# Patient Record
Sex: Female | Born: 1980 | Race: White | Hispanic: No | Marital: Single | Smoking: Never smoker
Health system: Southern US, Community
[De-identification: ages and names within clinical notes are randomized; demographics above are authoritative.]

## PROBLEM LIST (undated history)

## (undated) DIAGNOSIS — F4310 Post-traumatic stress disorder, unspecified: Secondary | ICD-10-CM

---

## 2017-05-26 ENCOUNTER — Encounter (HOSPITAL_COMMUNITY): Payer: Self-pay

## 2017-05-26 ENCOUNTER — Emergency Department (HOSPITAL_COMMUNITY): Payer: Self-pay

## 2017-05-26 ENCOUNTER — Inpatient Hospital Stay (HOSPITAL_COMMUNITY)
Admission: EM | Admit: 2017-05-26 | Discharge: 2017-05-29 | DRG: 897 | Disposition: A | Discharge status: Home / Self Care | Payer: Self-pay | Attending: Family Medicine | Admitting: Family Medicine

## 2017-05-26 DIAGNOSIS — Z818 Family history of other mental and behavioral disorders: Secondary | ICD-10-CM

## 2017-05-26 DIAGNOSIS — R079 Chest pain, unspecified: Secondary | ICD-10-CM

## 2017-05-26 DIAGNOSIS — R778 Other specified abnormalities of plasma proteins: Secondary | ICD-10-CM

## 2017-05-26 DIAGNOSIS — F29 Unspecified psychosis not due to a substance or known physiological condition: Secondary | ICD-10-CM

## 2017-05-26 DIAGNOSIS — F15129 Other stimulant abuse with intoxication, unspecified: Principal | ICD-10-CM | POA: Diagnosis present

## 2017-05-26 DIAGNOSIS — F209 Schizophrenia, unspecified: Secondary | ICD-10-CM | POA: Diagnosis present

## 2017-05-26 DIAGNOSIS — F1510 Other stimulant abuse, uncomplicated: Secondary | ICD-10-CM

## 2017-05-26 DIAGNOSIS — F4310 Post-traumatic stress disorder, unspecified: Secondary | ICD-10-CM | POA: Diagnosis present

## 2017-05-26 DIAGNOSIS — R7989 Other specified abnormal findings of blood chemistry: Secondary | ICD-10-CM

## 2017-05-26 DIAGNOSIS — F15929 Other stimulant use, unspecified with intoxication, unspecified: Secondary | ICD-10-CM | POA: Diagnosis present

## 2017-05-26 DIAGNOSIS — E876 Hypokalemia: Secondary | ICD-10-CM

## 2017-05-26 HISTORY — DX: Post-traumatic stress disorder, unspecified: F43.10

## 2017-05-26 LAB — CBC WITH DIFFERENTIAL/PLATELET
BASOS ABS: 0 10*3/uL (ref 0.0–0.1)
Basophils Relative: 0 %
EOS ABS: 0 10*3/uL (ref 0.0–0.7)
EOS PCT: 0 %
HCT: 35.6 % — ABNORMAL LOW (ref 36.0–46.0)
Hemoglobin: 12 g/dL (ref 12.0–15.0)
LYMPHS PCT: 4 %
Lymphs Abs: 0.8 10*3/uL (ref 0.7–4.0)
MCH: 30.5 pg (ref 26.0–34.0)
MCHC: 33.7 g/dL (ref 30.0–36.0)
MCV: 90.4 fL (ref 78.0–100.0)
Monocytes Absolute: 0.4 10*3/uL (ref 0.1–1.0)
Monocytes Relative: 2 %
Neutro Abs: 17.3 10*3/uL — ABNORMAL HIGH (ref 1.7–7.7)
Neutrophils Relative %: 94 %
PLATELETS: 297 10*3/uL (ref 150–400)
RBC: 3.94 MIL/uL (ref 3.87–5.11)
RDW: 13.2 % (ref 11.5–15.5)
WBC: 18.5 10*3/uL — ABNORMAL HIGH (ref 4.0–10.5)

## 2017-05-26 LAB — COMPREHENSIVE METABOLIC PANEL
ALBUMIN: 3.8 g/dL (ref 3.5–5.0)
ALT: 19 U/L (ref 14–54)
ANION GAP: 10 (ref 5–15)
AST: 32 U/L (ref 15–41)
Alkaline Phosphatase: 67 U/L (ref 38–126)
BUN: 10 mg/dL (ref 6–20)
CHLORIDE: 110 mmol/L (ref 101–111)
CO2: 20 mmol/L — AB (ref 22–32)
Calcium: 8.8 mg/dL — ABNORMAL LOW (ref 8.9–10.3)
Creatinine, Ser: 1.01 mg/dL — ABNORMAL HIGH (ref 0.44–1.00)
GFR calc Af Amer: >60 mL/min (ref >60)
GFR calc non Af Amer: >60 mL/min (ref >60)
GLUCOSE: 76 mg/dL (ref 65–99)
POTASSIUM: 3.2 mmol/L — AB (ref 3.5–5.1)
SODIUM: 140 mmol/L (ref 135–145)
TOTAL PROTEIN: 6.6 g/dL (ref 6.5–8.1)
Total Bilirubin: 1.1 mg/dL (ref 0.3–1.2)

## 2017-05-26 LAB — PREGNANCY, URINE: Preg Test, Ur: NEGATIVE

## 2017-05-26 LAB — URINALYSIS, ROUTINE W REFLEX MICROSCOPIC
Bilirubin Urine: NEGATIVE
GLUCOSE, UA: NEGATIVE mg/dL
Ketones, ur: 20 mg/dL — AB
LEUKOCYTES UA: NEGATIVE
NITRITE: NEGATIVE
PROTEIN: 30 mg/dL — AB
Specific Gravity, Urine: 1.017 (ref 1.005–1.030)
pH: 6 (ref 5.0–8.0)

## 2017-05-26 LAB — RAPID URINE DRUG SCREEN, HOSP PERFORMED
Amphetamines: POSITIVE — AB
BARBITURATES: NOT DETECTED
BENZODIAZEPINES: NOT DETECTED
Cocaine: NOT DETECTED
Opiates: NOT DETECTED
Tetrahydrocannabinol: NOT DETECTED

## 2017-05-26 LAB — TROPONIN I: TROPONIN I: 0.09 ng/mL — AB (ref <0.03)

## 2017-05-26 LAB — ETHANOL: Alcohol, Ethyl (B): <5 mg/dL (ref <5)

## 2017-05-26 MED ORDER — SODIUM CHLORIDE 0.9 % IV BOLUS (SEPSIS)
500.0000 mL | Freq: Once | INTRAVENOUS | Status: AC
  Administered 2017-05-27: 500 mL via INTRAVENOUS
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
## 2017-05-27 ENCOUNTER — Encounter (HOSPITAL_COMMUNITY): Payer: Self-pay | Admitting: Family Medicine

## 2017-05-27 DIAGNOSIS — E876 Hypokalemia: Secondary | ICD-10-CM | POA: Diagnosis present

## 2017-05-27 DIAGNOSIS — F4310 Post-traumatic stress disorder, unspecified: Secondary | ICD-10-CM

## 2017-05-27 DIAGNOSIS — R7989 Other specified abnormal findings of blood chemistry: Secondary | ICD-10-CM

## 2017-05-27 DIAGNOSIS — F29 Unspecified psychosis not due to a substance or known physiological condition: Secondary | ICD-10-CM

## 2017-05-27 DIAGNOSIS — R778 Other specified abnormalities of plasma proteins: Secondary | ICD-10-CM | POA: Diagnosis present

## 2017-05-27 DIAGNOSIS — F15929 Other stimulant use, unspecified with intoxication, unspecified: Secondary | ICD-10-CM | POA: Diagnosis present

## 2017-05-27 LAB — BASIC METABOLIC PANEL
Anion gap: 8 (ref 5–15)
BUN: 9 mg/dL (ref 6–20)
CHLORIDE: 115 mmol/L — AB (ref 101–111)
CO2: 17 mmol/L — ABNORMAL LOW (ref 22–32)
CREATININE: 0.65 mg/dL (ref 0.44–1.00)
Calcium: 8.4 mg/dL — ABNORMAL LOW (ref 8.9–10.3)
GFR calc Af Amer: >60 mL/min (ref >60)
GFR calc non Af Amer: >60 mL/min (ref >60)
GLUCOSE: 84 mg/dL (ref 65–99)
POTASSIUM: 4.2 mmol/L (ref 3.5–5.1)
SODIUM: 140 mmol/L (ref 135–145)

## 2017-05-27 LAB — TROPONIN I
TROPONIN I: 0.11 ng/mL — AB (ref <0.03)
TROPONIN I: 0.06 ng/mL — AB (ref <0.03)
TROPONIN I: 0.05 ng/mL — AB (ref <0.03)
Troponin I: 0.06 ng/mL (ref <0.03)

## 2017-05-27 LAB — CBC
HEMATOCRIT: 36.1 % (ref 36.0–46.0)
Hemoglobin: 11.7 g/dL — ABNORMAL LOW (ref 12.0–15.0)
MCH: 29.9 pg (ref 26.0–34.0)
MCHC: 32.4 g/dL (ref 30.0–36.0)
MCV: 92.3 fL (ref 78.0–100.0)
PLATELETS: 280 10*3/uL (ref 150–400)
RBC: 3.91 MIL/uL (ref 3.87–5.11)
RDW: 13.5 % (ref 11.5–15.5)
WBC: 12.2 10*3/uL — AB (ref 4.0–10.5)

## 2017-05-27 LAB — MAGNESIUM: MAGNESIUM: 2.2 mg/dL (ref 1.7–2.4)

## 2017-05-27 LAB — MRSA PCR SCREENING: MRSA BY PCR: NEGATIVE

## 2017-05-27 MED ORDER — HEPARIN BOLUS VIA INFUSION
4000.0000 [IU] | Freq: Once | INTRAVENOUS | Status: AC
  Administered 2017-05-27: 4000 [IU] via INTRAVENOUS
  Filled 2017-05-27: qty 4000

## 2017-05-27 MED ORDER — ASPIRIN 81 MG PO CHEW
324.0000 mg | CHEWABLE_TABLET | Freq: Once | ORAL | Status: AC
  Administered 2017-05-27: 324 mg via ORAL
  Filled 2017-05-27: qty 4

## 2017-05-27 MED ORDER — HEPARIN (PORCINE) IN NACL 100-0.45 UNIT/ML-% IJ SOLN
1000.0000 [IU]/h | INTRAMUSCULAR | Status: DC
  Administered 2017-05-27: 1000 [IU]/h via INTRAVENOUS
  Filled 2017-05-27: qty 250

## 2017-05-27 MED ORDER — ONDANSETRON HCL 4 MG/2ML IJ SOLN: 4.0000 mg | Freq: Four times a day (QID) | INTRAMUSCULAR | Status: DC | PRN

## 2017-05-27 MED ORDER — LORAZEPAM 1 MG PO TABS: 1.0000 mg | ORAL_TABLET | Freq: Four times a day (QID) | ORAL | Status: DC | PRN

## 2017-05-27 MED ORDER — ACETAMINOPHEN 325 MG PO TABS: 650.0000 mg | ORAL_TABLET | ORAL | Status: DC | PRN

## 2017-05-27 MED ORDER — POTASSIUM CHLORIDE CRYS ER 20 MEQ PO TBCR
40.0000 meq | EXTENDED_RELEASE_TABLET | Freq: Once | ORAL | Status: AC
  Administered 2017-05-27: 40 meq via ORAL
  Filled 2017-05-27: qty 2

## 2017-05-27 MED ORDER — NITROGLYCERIN 0.4 MG SL SUBL: 0.4000 mg | SUBLINGUAL_TABLET | SUBLINGUAL | Status: DC | PRN

## 2017-05-27 MED ORDER — ASPIRIN EC 81 MG PO TBEC
81.0000 mg | DELAYED_RELEASE_TABLET | Freq: Every day | ORAL | Status: DC
  Administered 2017-05-28 – 2017-05-29 (×2): 81 mg via ORAL
  Filled 2017-05-27 (×2): qty 1

## 2017-05-27 MED ORDER — LORAZEPAM 2 MG/ML IJ SOLN
1.0000 mg | Freq: Four times a day (QID) | INTRAMUSCULAR | Status: DC | PRN
Start: 2017-05-27 — End: 2017-05-29
  Administered 2017-05-27: 1 mg via INTRAVENOUS
  Filled 2017-05-27: qty 1
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
## 2017-05-27 NOTE — ED Notes (Addendum)
Tacey Heap, RN and Dr Janee Morn aware of Troponin 0.06.

## 2017-05-27 NOTE — Progress Notes (Signed)
ANTICOAGULATION CONSULT NOTE - Initial Consult  Pharmacy Consult for heparin Indication: chest pain/ACS  No Known Allergies  Patient Measurements: Height:  (162.6 cm) Weight: 190 lb (86.2 kg) IBW/kg (Calculated) : 54.7 Heparin Dosing Weight: 75kg  Vital Signs: Temp: 97.9 F (36.6 C) (09/24 2315) Temp Source: Rectal (09/24 2315) BP: 100/60 (09/25 0300) Pulse Rate: 96 (09/25 0300)  Labs:  Recent Labs  05/26/17 2148 05/27/17 0100  HGB 12.0  --   HCT 35.6*  --   PLT 297  --   CREATININE 1.01*  --   TROPONINI 0.09* 0.11*    Estimated Creatinine Clearance: 83.4 mL/min (A) (by C-G formula based on SCr of 1.01 mg/dL (H)).   Assessment: 36yo female c/o generalized CP, initial troponin elevated and now trending up, to begin heparin.  Goal of Therapy:  Heparin level 0.3-0.7 units/ml Monitor platelets by anticoagulation protocol: Yes   Plan:  Will give heparin 4000 units VI bolus x1 followed by gtt at 1000 units/hr and monitor heparin levels and CBC.  Vernard Gambles, PharmD, BCPS  05/27/2017,3:11 AM

## 2017-05-27 NOTE — ED Notes (Signed)
Patient given Ativan 1 mg IV for anxiety this morning .
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
## 2017-05-27 NOTE — ED Notes (Signed)
  Cards PA here to see patient

## 2017-05-27 NOTE — ED Provider Notes (Signed)
Patient signed out to me to evaluate repeat troponin. She had presented with chest pain and mental status change worrisome for acute psychiatric event. Drug screen was positive for amphetamines. ECG showed borderline T-wave flattening. Initial troponin was elevated at 0.09. Repeat troponin has gone up slightly to 0.11. She is started on heparin and will need to be admittedto monitor cardiac enzymes. Once she is cleared from cardiac standpoint, she will need psychiatric evaluation. Case is discussed with Dr. Maryfrances Bunnell of [facility] who agrees to admit the patient. Also, she is noted to have hypokalemia and is given potassium supplement.  Results for orders placed or performed during the hospital encounter of 05/26/17  Comprehensive metabolic panel  Result Value Ref Range   Sodium 140 135 - 145 mmol/L   Potassium 3.2 (L) 3.5 - 5.1 mmol/L   Chloride 110 101 - 111 mmol/L   CO2 20 (L) 22 - 32 mmol/L   Glucose, Bld 76 65 - 99 mg/dL   BUN 10 6 - 20 mg/dL   Creatinine, Ser 1.61 (H) 0.44 - 1.00 mg/dL   Calcium 8.8 (L) 8.9 - 10.3 mg/dL   Total Protein 6.6 6.5 - 8.1 g/dL   Albumin 3.8 3.5 - 5.0 g/dL   AST 32 15 - 41 U/L   ALT 19 14 - 54 U/L   Alkaline Phosphatase 67 38 - 126 U/L   Total Bilirubin 1.1 0.3 - 1.2 mg/dL   GFR calc non Af Amer >60 >60 mL/min   GFR calc Af Amer >60 >60 mL/min   Anion gap 10 5 - 15  Ethanol  Result Value Ref Range   Alcohol, Ethyl (B) <5 <5 mg/dL  Urinalysis, Routine w reflex microscopic  Result Value Ref Range   Color, Urine YELLOW YELLOW   APPearance HAZY (A) CLEAR   Specific Gravity, Urine 1.017 1.005 - 1.030   pH 6.0 5.0 - 8.0   Glucose, UA NEGATIVE NEGATIVE mg/dL   Hgb urine dipstick MODERATE (A) NEGATIVE   Bilirubin Urine NEGATIVE NEGATIVE   Ketones, ur 20 (A) NEGATIVE mg/dL   Protein, ur 30 (A) NEGATIVE mg/dL   Nitrite NEGATIVE NEGATIVE   Leukocytes, UA NEGATIVE NEGATIVE   RBC / HPF 0-5 0 - 5 RBC/hpf   WBC, UA 0-5 0 - 5 WBC/hpf   Bacteria, UA RARE  (A) NONE SEEN   Squamous Epithelial / LPF 0-5 (A) NONE SEEN   Mucus PRESENT    Hyaline Casts, UA PRESENT   Pregnancy, urine  Result Value Ref Range   Preg Test, Ur NEGATIVE NEGATIVE  Urine rapid drug screen (hosp performed)  Result Value Ref Range   Opiates NONE DETECTED NONE DETECTED   Cocaine NONE DETECTED NONE DETECTED   Benzodiazepines NONE DETECTED NONE DETECTED   Amphetamines POSITIVE (A) NONE DETECTED   Tetrahydrocannabinol NONE DETECTED NONE DETECTED   Barbiturates NONE DETECTED NONE DETECTED  CBC with Differential  Result Value Ref Range   WBC 18.5 (H) 4.0 - 10.5 K/uL   RBC 3.94 3.87 - 5.11 MIL/uL   Hemoglobin 12.0 12.0 - 15.0 g/dL   HCT 09.6 (L) 04.5 - 40.9 %   MCV 90.4 78.0 - 100.0 fL   MCH 30.5 26.0 - 34.0 pg   MCHC 33.7 30.0 - 36.0 g/dL   RDW 81.1 91.4 - 78.2 %   Platelets 297 150 - 400 K/uL   Neutrophils Relative % 94 %   Neutro Abs 17.3 (H) 1.7 - 7.7 K/uL   Lymphocytes Relative 4 %  Lymphs Abs 0.8 0.7 - 4.0 K/uL   Monocytes Relative 2 %   Monocytes Absolute 0.4 0.1 - 1.0 K/uL   Eosinophils Relative 0 %   Eosinophils Absolute 0.0 0.0 - 0.7 K/uL   Basophils Relative 0 %   Basophils Absolute 0.0 0.0 - 0.1 K/uL  Troponin I  Result Value Ref Range   Troponin I 0.09 (HH) <0.03 ng/mL  Troponin I  Result Value Ref Range   Troponin I 0.11 (HH) <0.03 ng/mL   Ct Head Wo Contrast  Result Date: 05/26/2017 CLINICAL DATA:  Altered level of consciousness. EXAM: CT HEAD WITHOUT CONTRAST TECHNIQUE: Contiguous axial images were obtained from the base of the skull through the vertex without intravenous contrast. COMPARISON:  None. FINDINGS: Brain: No evidence of acute infarction, hemorrhage, hydrocephalus, extra-axial collection or mass lesion/mass effect. Mild advanced for age bifrontal atrophy. Vascular: No hyperdense vessel or unexpected calcification. Skull: Normal. Negative for fracture or focal lesion. Sinuses/Orbits: No acute finding. Tiny mucous retention cyst in the  sphenoid. Other: None IMPRESSION: No acute intracranial abnormality.  Mild bifrontal atrophy for age. Electronically Signed   By: Tollie Eth M.D.   On: 05/26/2017 22:14   Dg Chest Portable 1 View  Result Date: 05/26/2017 CLINICAL DATA:  Altered mental status EXAM: PORTABLE CHEST 1 VIEW COMPARISON:  None. FINDINGS: Mildly low lung volumes. No acute consolidation or effusion. Normal cardiomediastinal silhouette. No pneumothorax. IMPRESSION: No active disease. Electronically Signed   By: Jasmine Pang M.D.   On: 05/26/2017 21:49   CRITICAL CARE Performed by: [email or web address] Total critical care time: 35 minutes Critical care time was exclusive of separately billable procedures and treating other patients. Critical care was necessary to treat or prevent imminent or life-threatening deterioration. Critical care was time spent personally by me on the following activities: development of treatment plan with patient and/or surrogate as well as nursing, discussions with consultants, evaluation of patient's response to treatment, examination of patient, obtaining history from patient or surrogate, ordering and performing treatments and interventions, ordering and review of laboratory studies, ordering and review of radiographic studies, pulse oximetry and re-evaluation of patient's condition.   Dione Booze, MD 05/27/17 [phone]

## 2017-05-27 NOTE — ED Notes (Signed)
Dr. Preston Fleeting notified on pt.'s elevated troponin result.
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
## 2017-05-27 NOTE — ED Notes (Signed)
Report called to 6E 

## 2017-05-27 NOTE — ED Notes (Signed)
This RN attempted to wanr patient by voice several times before waking her to draw her blood.  Patient jumped and flinched in the bed.  Patient states "I'm really confused, what are you doing to me?".  This RN had to explain procedure to pull blood from IV multiple times.  Patient did not want RN to remove blood at first, but after explaining a third time, patient agreed.  Patient then suspicious and concerned when RN needed to flush line after.
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
## 2017-05-28 ENCOUNTER — Other Ambulatory Visit (HOSPITAL_COMMUNITY): Payer: Self-pay

## 2017-05-28 ENCOUNTER — Inpatient Hospital Stay (HOSPITAL_COMMUNITY): Payer: Self-pay

## 2017-05-28 DIAGNOSIS — R0789 Other chest pain: Secondary | ICD-10-CM

## 2017-05-28 DIAGNOSIS — Z630 Problems in relationship with spouse or partner: Secondary | ICD-10-CM

## 2017-05-28 DIAGNOSIS — R079 Chest pain, unspecified: Secondary | ICD-10-CM

## 2017-05-28 DIAGNOSIS — R748 Abnormal levels of other serum enzymes: Secondary | ICD-10-CM

## 2017-05-28 DIAGNOSIS — F22 Delusional disorders: Secondary | ICD-10-CM

## 2017-05-28 DIAGNOSIS — Z818 Family history of other mental and behavioral disorders: Secondary | ICD-10-CM

## 2017-05-28 DIAGNOSIS — F29 Unspecified psychosis not due to a substance or known physiological condition: Secondary | ICD-10-CM

## 2017-05-28 DIAGNOSIS — Z813 Family history of other psychoactive substance abuse and dependence: Secondary | ICD-10-CM

## 2017-05-28 LAB — ECHOCARDIOGRAM COMPLETE
Height: 64 in
WEIGHTICAEL: 2918.4 [oz_av]

## 2017-05-28 LAB — HIV ANTIBODY (ROUTINE TESTING W REFLEX): HIV Screen 4th Generation wRfx: NONREACTIVE

## 2017-05-28 NOTE — Progress Notes (Signed)
  Echocardiogram 2D Echocardiogram has been performed.  Celene Skeen 05/28/2017, 10:30 AM

## 2017-05-28 NOTE — Plan of Care (Signed)
Problem: Pain Managment: Goal: General experience of comfort will improve Outcome: Progressing Patient denies pain   
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
## 2017-05-28 NOTE — Progress Notes (Signed)
Progress Note  Patient Name: Samantha Petty Date of Encounter: 05/28/2017  Primary Cardiologist: New - Dr. Delton See  Subjective   Resting this morning but arousable and answering questions. She denies any recurrent chest pain or dyspnea. Reports having little energy.   Inpatient Medications    Scheduled Meds: . aspirin EC  81 mg Oral Daily   Continuous Infusions:  PRN Meds: acetaminophen, LORazepam **OR** LORazepam, nitroGLYCERIN, ondansetron (ZOFRAN) IV   Vital Signs    Vitals:   05/27/17 1400 05/28/17 0100 05/28/17 0605 05/28/17 0812  BP: (!) 89/70 101/67 111/71 123/76  Pulse: 97 90 87 89  Resp: (!) 22 20 (!) 23   Temp:  98 F (36.7 C) 98.1 F (36.7 C) 97.7 F (36.5 C)  TempSrc:  Oral Oral Oral  SpO2: 98% 100% 100% 100%  Weight:   182 lb 6.4 oz (82.7 kg)   Height:       No intake or output data in the 24 hours ending 05/28/17 0904 Filed Weights   05/27/17 0300 05/28/17 0605  Weight: 190 lb (86.2 kg) 182 lb 6.4 oz (82.7 kg)    Telemetry    NSR, HR in 80's to low-100's.  - Personally Reviewed  ECG    Sinus tachycardia, HR 106.  - Personally Reviewed  Physical Exam   General: Well developed, well nourished Caucaisan female appearing in no acute distress. Head: Normocephalic, small laceration to forehead  Neck: Supple without bruits, JVD not elevated. Lungs:  Resp regular and unlabored, CTA without wheezing or rales. Heart: RRR, S1, S2, no S3, S4, or murmur; no rub. Abdomen: Soft, non-tender, non-distended with normoactive bowel sounds. No hepatomegaly. No rebound/guarding. No obvious abdominal masses. Extremities: No clubbing, cyanosis, or edema. Distal pedal pulses are 2+ bilaterally. Multiple excoriations noted along lower extremities bilaterally.  Neuro: Alert and oriented X 3. Moves all extremities spontaneously. Psych: Normal affect.  Labs    Chemistry Recent Labs Lab 05/26/17 2148 05/27/17 0957  NA 140 140  K 3.2* 4.2  CL 110 115*  CO2 20*  17*  GLUCOSE 76 84  BUN 10 9  CREATININE 1.01* 0.65  CALCIUM 8.8* 8.4*  PROT 6.6  --   ALBUMIN 3.8  --   AST 32  --   ALT 19  --   ALKPHOS 67  --   BILITOT 1.1  --   GFRNONAA >60 >60  GFRAA >60 >60  ANIONGAP 10 8     Hematology Recent Labs Lab 05/26/17 2148 05/27/17 0957  WBC 18.5* 12.2*  RBC 3.94 3.91  HGB 12.0 11.7*  HCT 35.6* 36.1  MCV 90.4 92.3  MCH 30.5 29.9  MCHC 33.7 32.4  RDW 13.2 13.5  PLT 297 280    Cardiac Enzymes Recent Labs Lab 05/27/17 0100 05/27/17 1135 05/27/17 1545 05/27/17 2210  TROPONINI 0.11* 0.06* 0.06* 0.05*   No results for input(s): TROPIPOC in the last 168 hours.   BNPNo results for input(s): BNP, PROBNP in the last 168 hours.   DDimer No results for input(s): DDIMER in the last 168 hours.   Radiology    Ct Head Wo Contrast  Result Date: 05/26/2017 CLINICAL DATA:  Altered level of consciousness. EXAM: CT HEAD WITHOUT CONTRAST TECHNIQUE: Contiguous axial images were obtained from the base of the skull through the vertex without intravenous contrast. COMPARISON:  None. FINDINGS: Brain: No evidence of acute infarction, hemorrhage, hydrocephalus, extra-axial collection or mass lesion/mass effect. Mild advanced for age bifrontal atrophy. Vascular: No hyperdense vessel or unexpected  calcification. Skull: Normal. Negative for fracture or focal lesion. Sinuses/Orbits: No acute finding. Tiny mucous retention cyst in the sphenoid. Other: None IMPRESSION: No acute intracranial abnormality.  Mild bifrontal atrophy for age. Electronically Signed   By: Tollie Eth M.D.   On: 05/26/2017 22:14   Dg Chest Portable 1 View  Result Date: 05/26/2017 CLINICAL DATA:  Altered mental status EXAM: PORTABLE CHEST 1 VIEW COMPARISON:  None. FINDINGS: Mildly low lung volumes. No acute consolidation or effusion. Normal cardiomediastinal silhouette. No pneumothorax. IMPRESSION: No active disease. Electronically Signed   By: Jasmine Pang M.D.   On: 05/26/2017 21:49     Cardiac Studies   Echocardiogram: Pending  Patient Profile     36 y.o. female w/ PMH of PTSD who presented to [facility] on 05/26/2017 for evaluation of confusion and paranoia. Cardiology consulted for evaluation of chest pain and elevated troponin.   Assessment & Plan    1. Chest Pain/ Elevated Troponin - presented with an episode of paranoia and confusion but reported chest pain throughout the day. EKG without acute ischemic changes. Cyclic troponin values have been flat at 0.09, 0.11, 0.06, 0.06, and 0.05. - echocardiogram is pending to assess LV Function and wall motion. If normal, no further cardiac evaluation is indicated this admission.   2. Amphetamine Intoxication/ Psychosis - per admitting team  Signed, Ellsworth Lennox , PA-C 9:04 AM 05/28/2017 Pager: [phone]   The patient was seen, examined and discussed with Randall An, PA-C and I agree with the above.    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  The patient remains chest pain free, Troponin minimally elevated and now downtrending.  Telemetry is normal. EKG shows sinus tachycardia otherwise normal EKG.  At this point she is chest pain-free, with minimal troponin elevation is now down trending. We will not plan for an ischemic workup. The mainstay in here would be stable way from drugs especially stimulants such an amphetamines. We will obtain an echocardiogram if normal we will sign off. If her chest pain recurs will consider an outpatient stress test.  Tobias Alexander, MD 05/28/2017

## 2017-05-29 DIAGNOSIS — F1510 Other stimulant abuse, uncomplicated: Secondary | ICD-10-CM

## 2017-05-29 DIAGNOSIS — R079 Chest pain, unspecified: Secondary | ICD-10-CM

## 2017-05-29 LAB — CBC
HEMATOCRIT: 37.8 % (ref 36.0–46.0)
Hemoglobin: 12.3 g/dL (ref 12.0–15.0)
MCH: 30.1 pg (ref 26.0–34.0)
MCHC: 32.5 g/dL (ref 30.0–36.0)
MCV: 92.6 fL (ref 78.0–100.0)
Platelets: 311 10*3/uL (ref 150–400)
RBC: 4.08 MIL/uL (ref 3.87–5.11)
RDW: 13.4 % (ref 11.5–15.5)
WBC: 6.7 10*3/uL (ref 4.0–10.5)

## 2017-05-29 MED ORDER — QUETIAPINE FUMARATE 50 MG PO TABS: 50.0000 mg | ORAL_TABLET | Freq: Every day | ORAL | 0 refills | Status: AC

## 2017-05-29 MED ORDER — QUETIAPINE FUMARATE 50 MG PO TABS: 50.0000 mg | ORAL_TABLET | Freq: Every day | ORAL | Status: DC

## 2017-05-29 NOTE — Progress Notes (Signed)
PROGRESS NOTE  Patient seen 05/28/17: Note entered 9/27  Samantha Petty  KGM:[id number] DOB: 20-Sep-1980 DOA: 05/26/2017 PCP: System, Pcp Not In    Brief Narrative:  36 y.o. female with a past medical history significant for PTSD who presents with acute paranoia, chest pain.   Assessment & Plan:    Psychosis - evaluated by psychiatry team and patient felt to be delusional. She will require inpatient psychiatric evaluation  Principal Problem:   Amphetamine intoxication (HCC) - Vital signs within normal limits. Pt was sleepy when I went into room. I suspect this has resolved  Active Problems:   Elevated troponin - most likely related to above problem. Pt has no complaints of chest pain    PTSD (post-traumatic stress disorder)   Hypokalemia - resolved on last check   DVT prophylaxis: early ambulation Code Status: Full Family Communication: none Disposition Plan: inpatient psychiatric recommendation  Consultants:   psychiatry   Procedures: none   Antimicrobials: none   Subjective: Pt has no new complaints. No chest pain reported. She requested to go home but had not been evaluated by psychiatry. After evaluation psychiatry recommended in patient evaluation at [facility]  Objective: Vitals:   05/28/17 1653 05/28/17 2105 05/29/17 0554 05/29/17 0744  BP: 116/86 122/82 123/80 131/87  Pulse:  94 91   Resp: (!) Temp: 98.3 F (36.8 C) 98.3 F (36.8 C) 98 F (36.7 C) 99.2 F (37.3 C)  TempSrc: Oral Oral Oral Oral  SpO2: 100% 100% 100% 98%  Weight:   83 kg (183 lb)   Height:       No intake or output data in the 24 hours ending 05/29/17 0932 Filed Weights   05/27/17 0300 05/28/17 0605 05/29/17 0554  Weight: 86.2 kg (190 lb) 82.7 kg (182 lb 6.4 oz) 83 kg (183 lb)    Examination:  General exam: Appears calm and comfortable  Respiratory system: Clear to auscultation. Respiratory effort normal. Cardiovascular system: S1 & S2 heard, RRR. No JVD, murmurs, rubs,  gallops or clicks. No pedal edema. Gastrointestinal system: Abdomen is nondistended, soft and nontender. No organomegaly or masses felt. Normal bowel sounds heard. Central nervous system: Alert and oriented. No focal neurological deficits. Extremities: Symmetric 5 x 5 power. Skin: No rashes, lesions or ulcers Psychiatry: Judgement and insight appear impaired    Data Reviewed: I have personally reviewed following labs and imaging studies  CBC:  Recent Labs Lab 05/26/17 2148 05/27/17 0957 05/29/17 0615  WBC 18.5* 12.2* 6.7  NEUTROABS 17.3*  --   --   HGB 12.0 11.7* 12.3  HCT 35.6* 36.1 37.8  MCV 90.4 92.3 92.6  PLT 297 280 311   Basic Metabolic Panel:  Recent Labs Lab 05/26/17 2148 05/27/17 0100 05/27/17 0957  NA 140  --  140  K 3.2*  --  4.2  CL 110  --  115*  CO2 20*  --  17*  GLUCOSE 76  --  84  BUN 10  --  9  CREATININE 1.01*  --  0.65  CALCIUM 8.8*  --  8.4*  MG  --  2.2  --    GFR: Estimated Creatinine Clearance: 103.2 mL/min (by C-G formula based on SCr of 0.65 mg/dL). Liver Function Tests:  Recent Labs Lab 05/26/17 2148  AST 32  ALT 19  ALKPHOS 67  BILITOT 1.1  PROT 6.6  ALBUMIN 3.8   No results for input(s): LIPASE, AMYLASE in the last 168 hours. No results for input(s):  AMMONIA in the last 168 hours. Coagulation Profile: No results for input(s): INR, PROTIME in the last 168 hours. Cardiac Enzymes:  Recent Labs Lab 05/26/17 2148 05/27/17 0100 05/27/17 1135 05/27/17 1545 05/27/17 2210  TROPONINI 0.09* 0.11* 0.06* 0.06* 0.05*   BNP (last 3 results) No results for input(s): PROBNP in the last 8760 hours. HbA1C: No results for input(s): HGBA1C in the last 72 hours. CBG: No results for input(s): GLUCAP in the last 168 hours. Lipid Profile: No results for input(s): CHOL, HDL, LDLCALC, TRIG, CHOLHDL, LDLDIRECT in the last 72 hours. Thyroid Function Tests: No results for input(s): TSH, T4TOTAL, FREET4, T3FREE, THYROIDAB in the last 72  hours. Anemia Panel: No results for input(s): VITAMINB12, FOLATE, FERRITIN, TIBC, IRON, RETICCTPCT in the last 72 hours. Sepsis Labs: No results for input(s): PROCALCITON, LATICACIDVEN in the last 168 hours.  Recent Results (from the past 240 hour(s))  MRSA PCR Screening     Status: None   Collection Time: 05/27/17  8:33 PM  Result Value Ref Range Status   MRSA by PCR NEGATIVE NEGATIVE Final    Comment:        The GeneXpert MRSA Assay (FDA approved for NASAL specimens only), is one component of a comprehensive MRSA colonization surveillance program. It is not intended to diagnose MRSA infection nor to guide or monitor treatment for MRSA infections.          Radiology Studies: No results found.      Scheduled Meds: . aspirin EC  81 mg Oral Daily   Continuous Infusions:   LOS: 2 days    Time spent: > 35 minutes    Penny Pia, MD [facility] Pager [phone]  If 7PM-7AM, please contact night-coverage [email or web address] Password [facility] 05/29/2017, 9:32 AM

## 2017-05-29 NOTE — Clinical Social Work Note (Signed)
Clinical Social Work Assessment  Patient Details  Name: Samantha Petty MRN: [id number] Date of Birth: 1981/07/19  Date of referral:  05/29/17               Reason for consult:  Mental Health Concerns                Permission sought to share information with:  Family Supports Permission granted to share information::  Yes, Verbal Permission Granted  Name::     Samantha Petty::     Relationship::  father  Contact Information:     Housing/Transportation Living arrangements for the past 2 months:  Single Family Home Source of Information:  Patient Patient Interpreter Needed:  None Criminal Activity/Legal Involvement Pertinent to Current Situation/Hospitalization:    Significant Relationships:  Parents, Significant Other Lives with:  Self, Significant Other Do you feel safe going back to the place where you live?  Yes Need for family participation in patient care:  No (Coment)  Care giving concerns:  Patient from home with family. Patient came in with paranoid delusions reportedly substance induced. Psych initially recommended inpatient psychiatric care. Patient cleared by psych today and discharged by MD.   Social Worker assessment / plan: CSW referred patient out to [facility] and [facility] before cleared by psych-neither had beds. CSW faxed out to other facilities. CSW then informed by psych that patient cleared and safe for discharge home. CSW met with patient at bedside. Patient calm and alert and oriented. Patient from [place] with significant other and father. Patient tearful about her dog which she lost while under the influence. CSW planned with patient for transportation home. CSW and patient called patient's father and boyfriend together. Boyfriend will pick patient up from hospital this evening. CSW signing off.   Employment status:    Insurance information:    PT Recommendations:  Not assessed at this time Information / Referral to community resources:  Inpatient Psychiatric Care  (Comment Required)  Patient/Family's Response to care: Patient appreciative of care.  Patient/Family's Understanding of and Emotional Response to Diagnosis, Current Treatment, and Prognosis: Patient understanding of condition and beginning to remember the events that happened while she was intoxicated. Patient wants to return home.  Emotional Assessment Appearance:  Appears stated age Attitude/Demeanor/Rapport:  Other (appropriate) Affect (typically observed):  Accepting, Calm Orientation:  Oriented to Self, Oriented to Situation, Oriented to Place, Oriented to  Time Alcohol / Substance use:  Illicit Drugs Psych involvement (Current and /or in the community):  Yes (Comment)  Discharge Needs  Concerns to be addressed:  Discharge Planning Concerns, Mental Health Concerns Readmission within the last 30 days:  No Current discharge risk:    Barriers to Discharge:  No Barriers Identified   Estanislado Emms, LCSW 05/29/2017, 4:46 PM

## 2017-05-29 NOTE — Plan of Care (Signed)
Problem: Physical Regulation: Goal: Will remain free from infection Outcome: Progressing VSS. Displays no signs or symptoms of infection. Educated on the importance of proper hand hygiene

## 2017-05-29 NOTE — Discharge Summary (Signed)
Physician Discharge Summary  Samantha Petty ZOX:[id number] DOB: 08/31/1981 DOA: 05/26/2017  PCP: System, Pcp Not In  Admit date: 05/26/2017 Discharge date: 05/29/2017  Time spent: > 35  minutes  Recommendations for Outpatient Follow-up:  1.    Discharge Diagnoses:  Principal Problem:   Amphetamine intoxication (HCC) Active Problems:   Elevated troponin   PTSD (post-traumatic stress disorder)   Hypokalemia   Psychosis   Discharge Condition: stable  Diet recommendation: regular diet  Filed Weights   05/27/17 0300 05/28/17 0605 05/29/17 0554  Weight: 86.2 kg (190 lb) 82.7 kg (182 lb 6.4 oz) 83 kg (183 lb)    History of present illness:  36 y.o. female with a past medical history significant for PTSD who presents with acute paranoia, chest pain.  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  Hospital Course:  Psychosis - evaluated by psychiatry team and patient felt to be delusional. She will require inpatient psychiatric evaluation - medically cleared to transition to inpatient psychiatric hospital.  Principal Problem:   Amphetamine intoxication (HCC) - Per report patient has acted this was with prior use per boyfriend. Psychiatry has recommended inpatient evaluation and seroquel for delusions.  Active Problems:   Elevated troponin -  Pt has no complaints of chest pain - normal echo, cardiology signed off.    PTSD (post-traumatic stress disorder)   Hypokalemia - resolved on last check   Procedures:  Echocardiogram  Consultations:  Cardiology: Dr. Delton See  Discharge Exam: Vitals:   05/29/17 0744 05/29/17 1143  BP: 131/87 124/77  Pulse:  70  Resp:    Temp: 99.2 F (37.3 C) 98.4 F (36.9 C)  SpO2: 98% 100%    General: Pt in nad, alert and awake Cardiovascular: rrr, no rubs Respiratory: no increased wob, no wheezes  Discharge Instructions   Discharge Instructions    Call MD for:   difficulty breathing, headache or visual disturbances    Complete by:  As directed    Call MD for:  temperature >100.4    Complete by:  As directed    Diet - low sodium heart healthy    Complete by:  As directed    Increase activity slowly    Complete by:  As directed      Current Discharge Medication List    START taking these medications   Details  QUEtiapine (SEROQUEL) 50 MG tablet Take 1 tablet (50 mg total) by mouth at bedtime.       No Known Allergies    The results of significant diagnostics from this hospitalization (including imaging, microbiology, ancillary and laboratory) are listed below for reference.    Significant Diagnostic Studies: Ct Head Wo Contrast  Result Date: 05/26/2017 CLINICAL DATA:  Altered level of consciousness. EXAM: CT HEAD WITHOUT CONTRAST TECHNIQUE: Contiguous axial images were obtained from the base of the skull through the vertex without intravenous contrast. COMPARISON:  None. FINDINGS: Brain: No evidence of acute infarction, hemorrhage, hydrocephalus, extra-axial collection or mass lesion/mass effect. Mild advanced for age bifrontal atrophy. Vascular: No hyperdense vessel or unexpected calcification. Skull: Normal. Negative for fracture or focal lesion. Sinuses/Orbits: No acute finding. Tiny mucous retention cyst in the sphenoid. Other: None IMPRESSION: No acute intracranial abnormality.  Mild bifrontal atrophy for age. Electronically Signed   By: Tollie Eth M.D.   On: 05/26/2017 22:14   Dg Chest Portable 1 View  Result Date: 05/26/2017 CLINICAL DATA:  Altered mental status EXAM: PORTABLE CHEST 1  VIEW COMPARISON:  None. FINDINGS: Mildly low lung volumes. No acute consolidation or effusion. Normal cardiomediastinal silhouette. No pneumothorax. IMPRESSION: No active disease. Electronically Signed   By: Jasmine Pang M.D.   On: 05/26/2017 21:49    Microbiology: Recent Results (from the past 240 hour(s))  MRSA PCR Screening     Status: None    Collection Time: 05/27/17  8:33 PM  Result Value Ref Range Status   MRSA by PCR NEGATIVE NEGATIVE Final    Comment:        The GeneXpert MRSA Assay (FDA approved for NASAL specimens only), is one component of a comprehensive MRSA colonization surveillance program. It is not intended to diagnose MRSA infection nor to guide or monitor treatment for MRSA infections.      Labs: Basic Metabolic Panel:  Recent Labs Lab 05/26/17 2148 05/27/17 0100 05/27/17 0957  NA 140  --  140  K 3.2*  --  4.2  CL 110  --  115*  CO2 20*  --  17*  GLUCOSE 76  --  84  BUN 10  --  9  CREATININE 1.01*  --  0.65  CALCIUM 8.8*  --  8.4*  MG  --  2.2  --    Liver Function Tests:  Recent Labs Lab 05/26/17 2148  AST 32  ALT 19  ALKPHOS 67  BILITOT 1.1  PROT 6.6  ALBUMIN 3.8   No results for input(s): LIPASE, AMYLASE in the last 168 hours. No results for input(s): AMMONIA in the last 168 hours. CBC:  Recent Labs Lab 05/26/17 2148 05/27/17 0957 05/29/17 0615  WBC 18.5* 12.2* 6.7  NEUTROABS 17.3*  --   --   HGB 12.0 11.7* 12.3  HCT 35.6* 36.1 37.8  MCV 90.4 92.3 92.6  PLT 297 280 311   Cardiac Enzymes:  Recent Labs Lab 05/26/17 2148 05/27/17 0100 05/27/17 1135 05/27/17 1545 05/27/17 2210  TROPONINI 0.09* 0.11* 0.06* 0.06* 0.05*   BNP: BNP (last 3 results) No results for input(s): BNP in the last 8760 hours.  ProBNP (last 3 results) No results for input(s): PROBNP in the last 8760 hours.  CBG: No results for input(s): GLUCAP in the last 168 hours.   Signed:  Penny Pia MD.  [facility] 05/29/2017, 1:32 PM

## 2017-05-29 NOTE — Progress Notes (Signed)
Echo is normal.  Cardiology signing off. No need for follow up unless further issues.  Please call for questions.

## 2017-05-29 NOTE — Progress Notes (Signed)
CSW consulted for inpatient psychiatric placement. [facility] and [facility] do not have psych beds available today. CSW to send out additional referrals.  Abigail Butts, LCSWA [phone]
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
## 2017-05-29 NOTE — Progress Notes (Signed)
inpt psych referral sent to the following facilities for review:  -[facility] [facility] -[facility] -[place]  Burna Sis, [place] Clinical Social Worker [phone]
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
## 2018-05-23 IMAGING — CT CT HEAD W/O CM
4 series · 16 of 47 positions shown, 18 images · non-contrast
Comparison: None.

CLINICAL DATA: Altered level of consciousness.

EXAM:
CT HEAD WITHOUT CONTRAST
TECHNIQUE: Contiguous axial images were obtained from the base of the skull
through the vertex without intravenous contrast.

[Series 3: head without · axial · non-contrast · 0.43mm/px · z∈[-154,-44]mm · 7 of 30 slices shown, 9 images]
[im 4/30  brain]
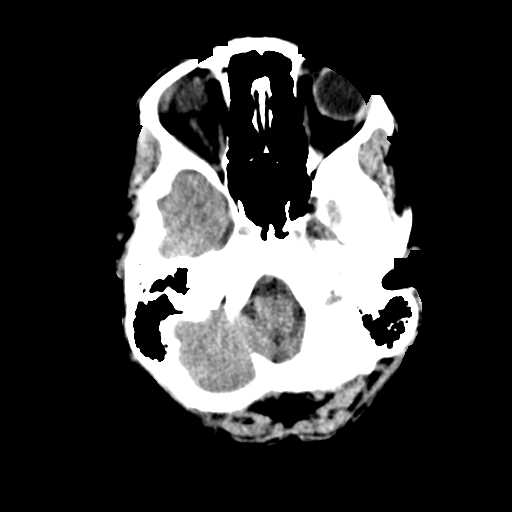
[im 4/30  bone]
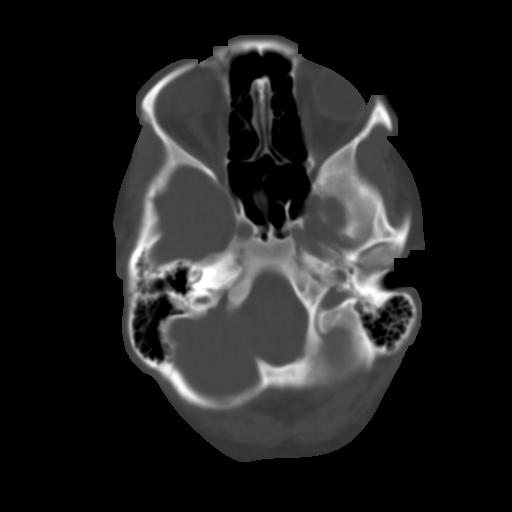
[im 8/30  brain]
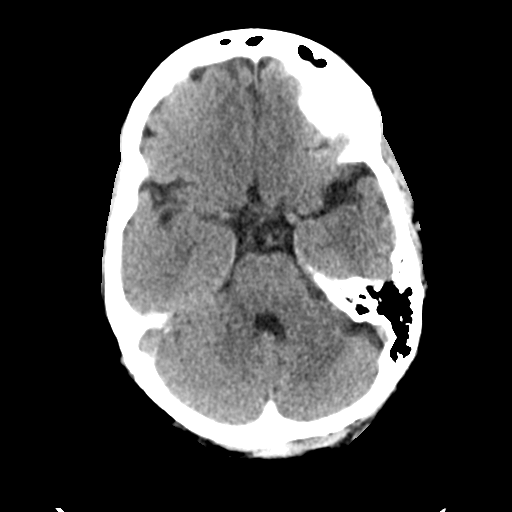
[im 11/30  brain]
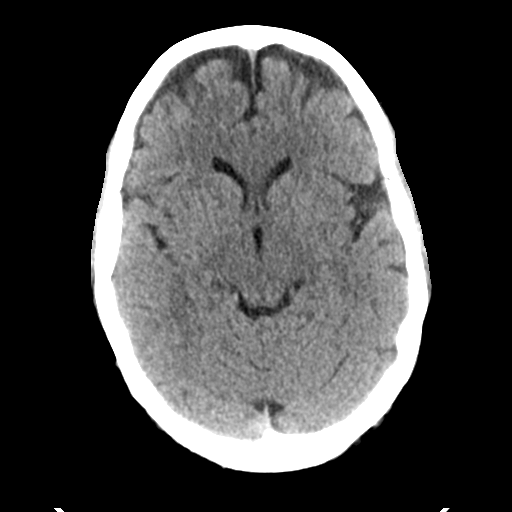
[im 15/30  brain]
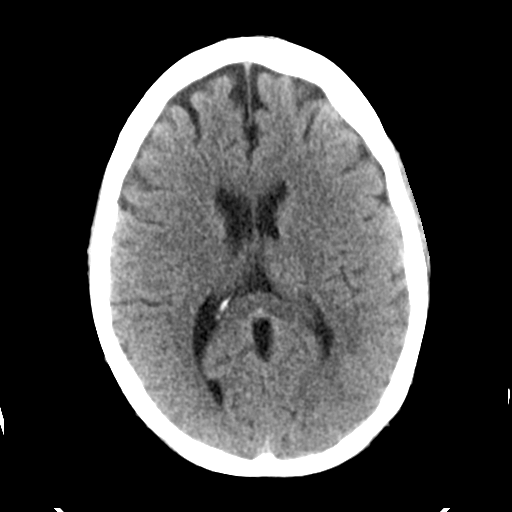
[im 19/30  brain]
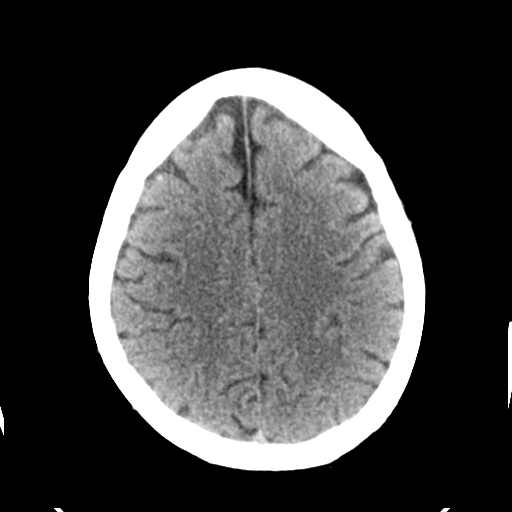
[im 19/30  bone]
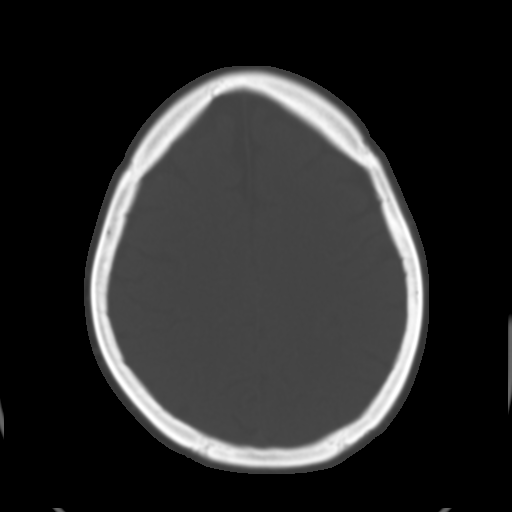
[im 22/30  brain]
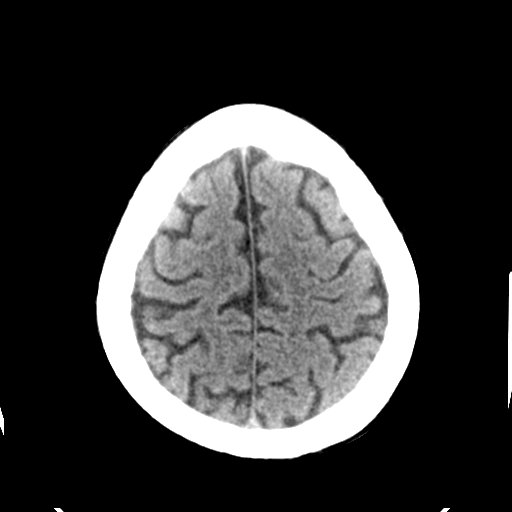
[im 26/30  brain]
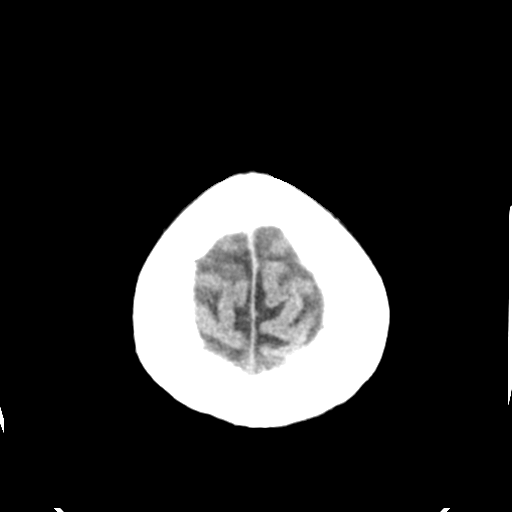

[Series 4: head bone · axial · 0.43mm/px · z∈[-155,-125]mm · 3 of 75 slices shown]
[im 8/75  bone]
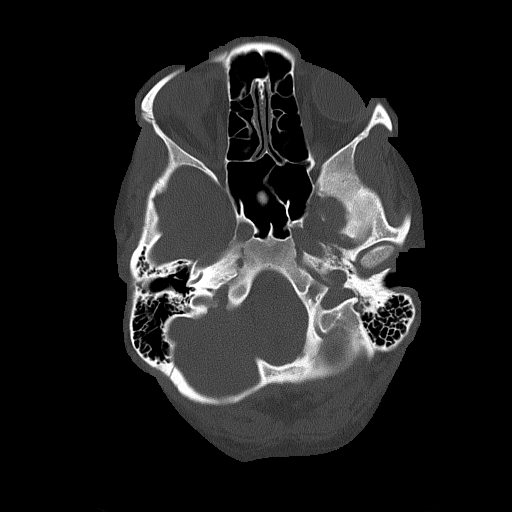
[im 15/75  bone]
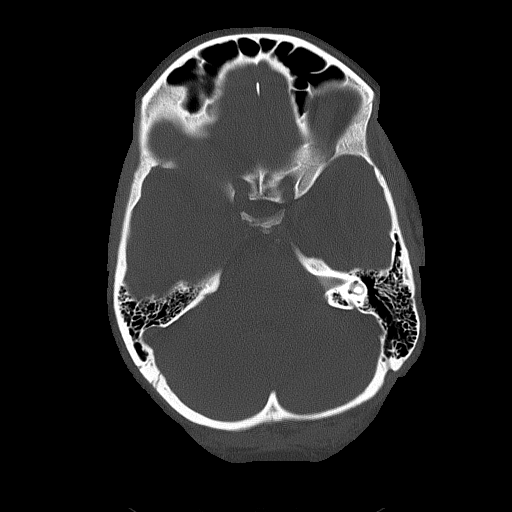
[im 23/75  bone]
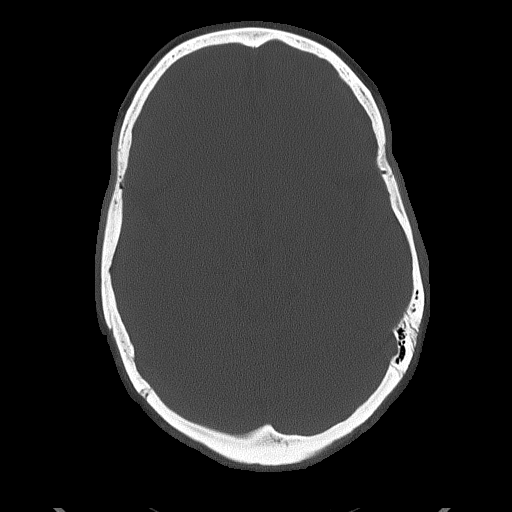

[Series 5: head without cor · coronal · non-contrast · 0.29mm/px · 3 of 72 slices shown]
[im 24/72  brain]
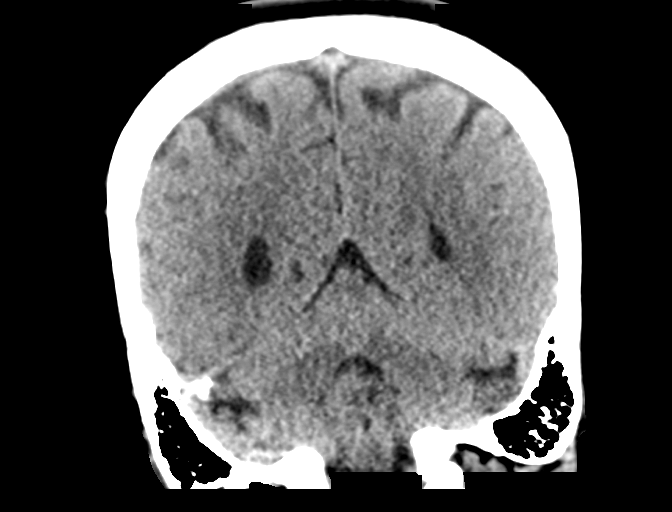
[im 32/72  brain]
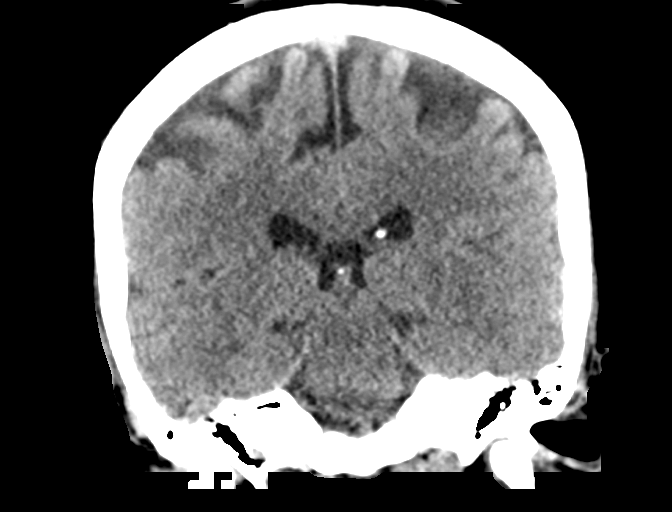
[im 40/72  brain]
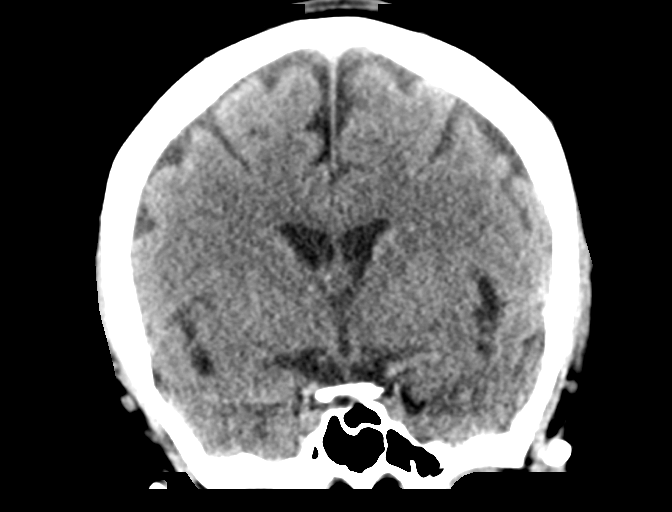

[Series 6: head without sag · sagittal · non-contrast · 0.33mm/px · 3 of 67 slices shown]
[im 23/67  brain]
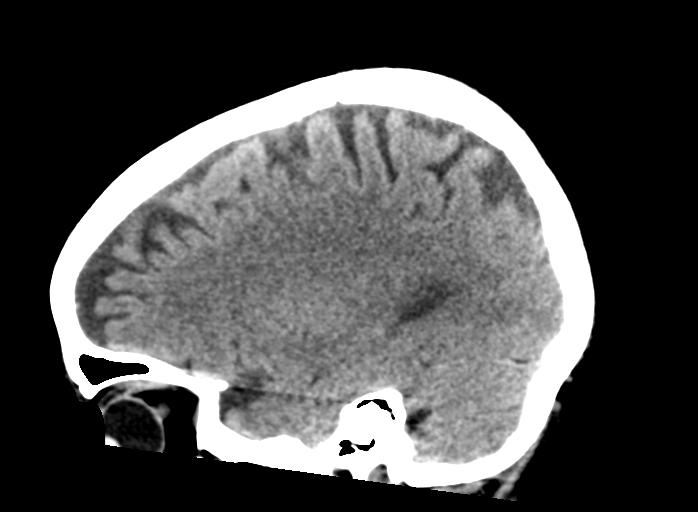
[im 34/67  brain]
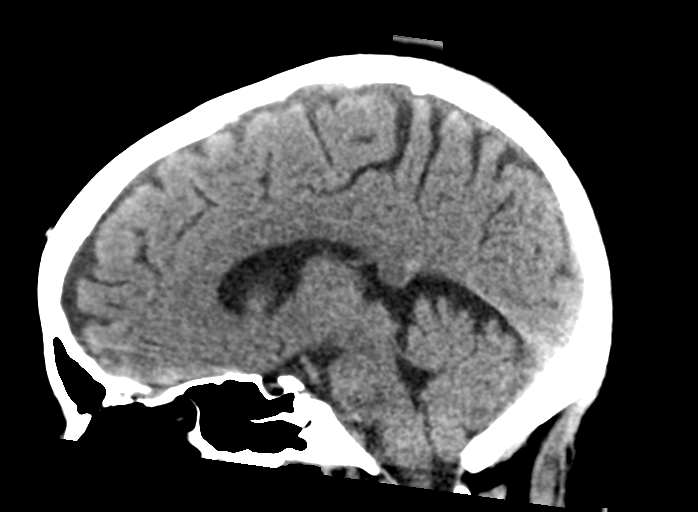
[im 45/67  brain]
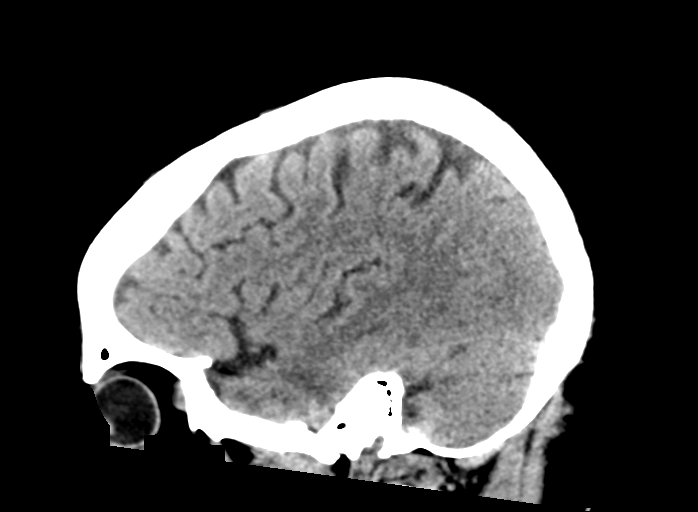

[16 of 47 positions shown; findings below may reference images not displayed]

FINDINGS: Brain: No evidence of acute infarction, hemorrhage, hydrocephalus,
extra-axial collection or mass lesion/mass effect. Mild advanced for
age bifrontal atrophy.

Vascular: No hyperdense vessel or unexpected calcification.

Skull: Normal. Negative for fracture or focal lesion.

Sinuses/Orbits: No acute finding. Tiny mucous retention cyst in the
sphenoid.

Other: None
IMPRESSION: No acute intracranial abnormality.  Mild bifrontal atrophy for age.

## 2018-05-23 IMAGING — DX DG CHEST 1V PORT
1 series · 1 of 1 positions shown · non-contrast
Comparison: None.

CLINICAL DATA: Altered mental status

EXAM:
PORTABLE CHEST 1 VIEW

[chest ap]
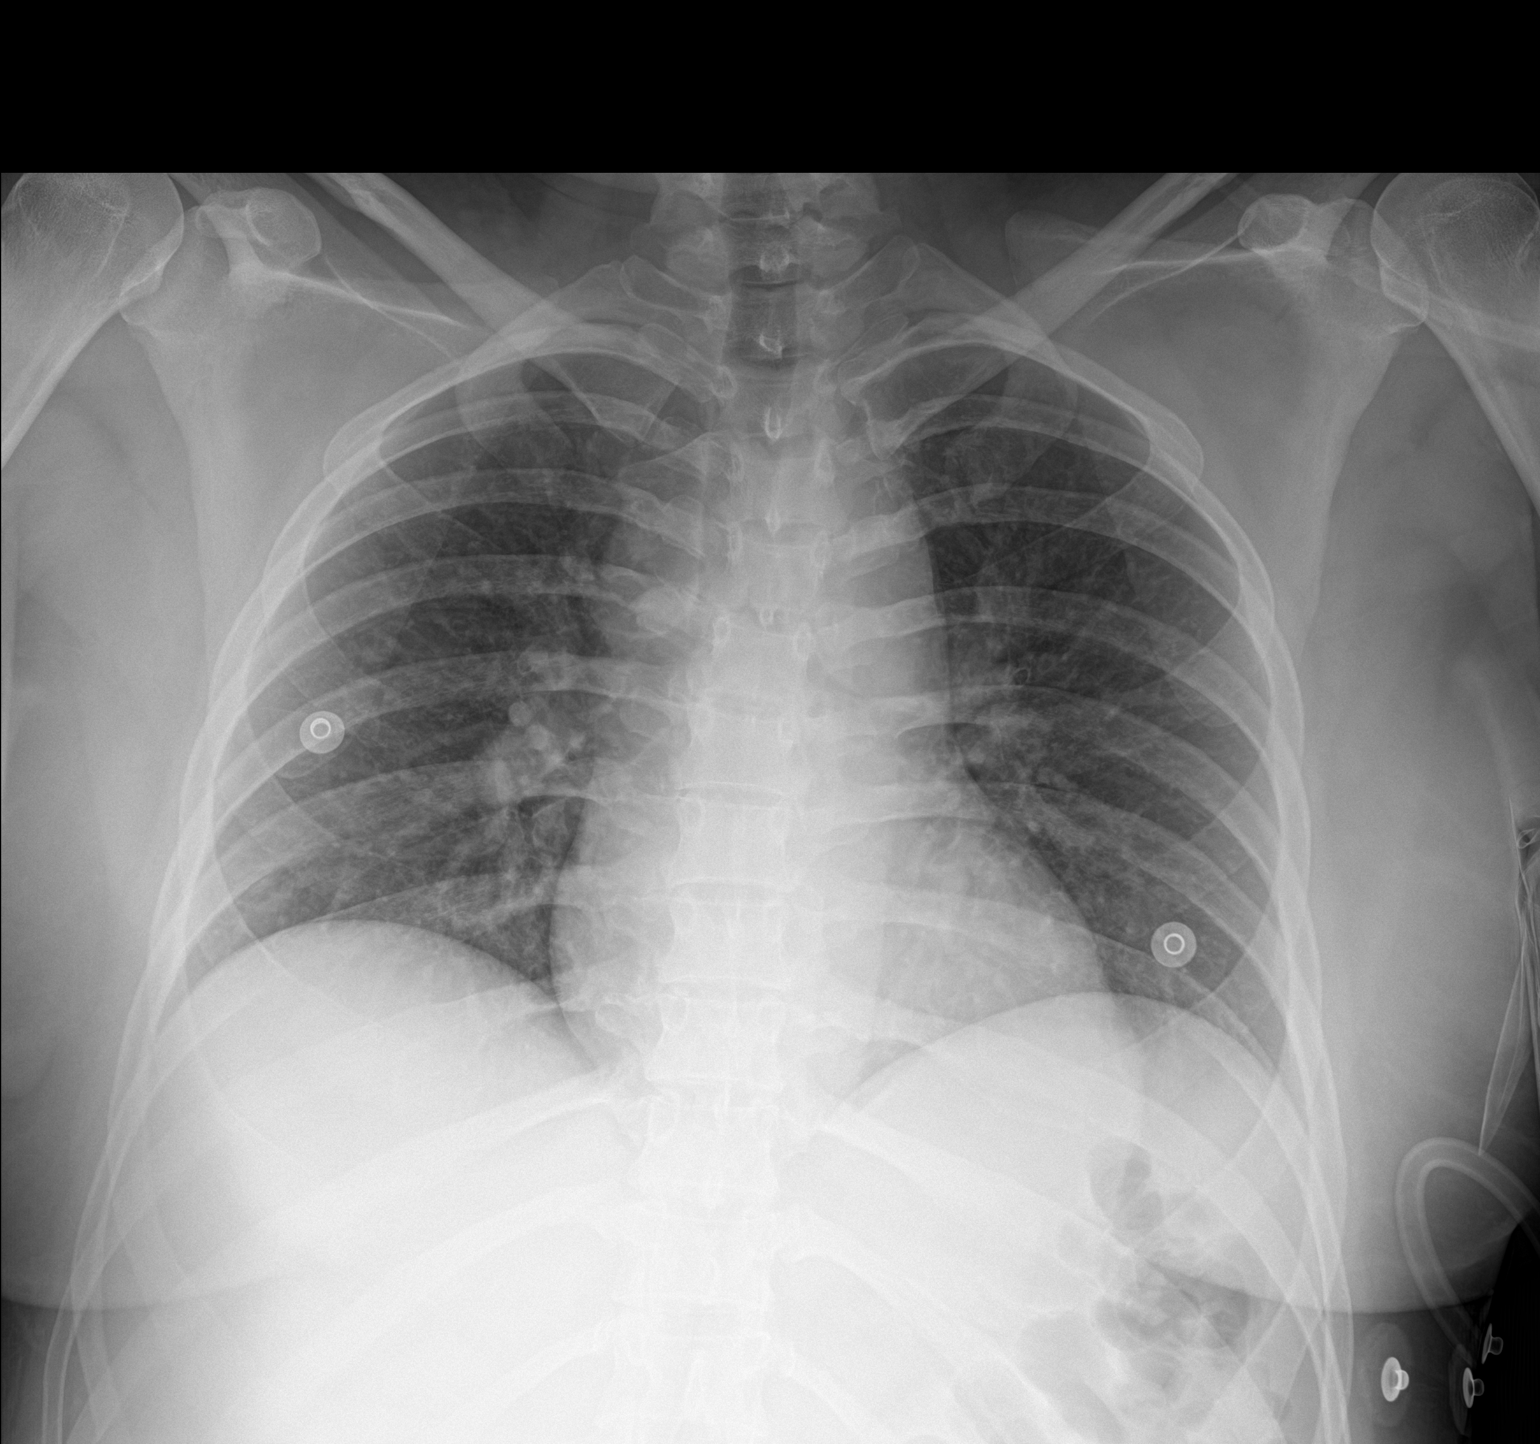

[1 of 1 positions shown; findings below may reference images not displayed]

FINDINGS: Mildly low lung volumes. No acute consolidation or effusion. Normal
cardiomediastinal silhouette. No pneumothorax.
IMPRESSION: No active disease.
